# Patient Record
Sex: Female | Born: 1990 | Race: White | Hispanic: No | Marital: Married | Smoking: Never smoker
Health system: Southern US, Community
[De-identification: ages and names within clinical notes are randomized; demographics above are authoritative.]

## PROBLEM LIST (undated history)

## (undated) HISTORY — PX: TONSILLECTOMY: SUR1361

---

## 2009-09-12 ENCOUNTER — Emergency Department (HOSPITAL_BASED_OUTPATIENT_CLINIC_OR_DEPARTMENT_OTHER): Admission: EM | Admit: 2009-09-12 | Discharge: 2009-09-12 | Payer: Self-pay | Admitting: Emergency Medicine

## 2010-08-26 LAB — DIFFERENTIAL
Basophils Absolute: 0.1 10*3/uL (ref 0.0–0.1)
Lymphocytes Relative: 26 % (ref 12–46)
Neutro Abs: 5.1 10*3/uL (ref 1.7–7.7)
Neutrophils Relative %: 62 % (ref 43–77)

## 2010-08-26 LAB — URINALYSIS, ROUTINE W REFLEX MICROSCOPIC
Protein, ur: NEGATIVE mg/dL
Urobilinogen, UA: 0.2 mg/dL (ref 0.0–1.0)

## 2010-08-26 LAB — CBC
Platelets: 267 10*3/uL (ref 150–400)
RDW: 13.2 % (ref 11.5–15.5)

## 2010-08-26 LAB — COMPREHENSIVE METABOLIC PANEL
ALT: 3 U/L (ref 0–35)
Albumin: 3.9 g/dL (ref 3.5–5.2)
Alkaline Phosphatase: 84 U/L (ref 39–117)
BUN: 9 mg/dL (ref 6–23)
Potassium: 4.2 mEq/L (ref 3.5–5.1)
Sodium: 143 mEq/L (ref 135–145)
Total Protein: 7.4 g/dL (ref 6.0–8.3)

## 2010-08-26 LAB — LIPASE, BLOOD: Lipase: 124 U/L (ref 23–300)

## 2010-08-26 LAB — ETHANOL: Alcohol, Ethyl (B): <5 mg/dL (ref 0–10)

## 2010-10-27 ENCOUNTER — Emergency Department (HOSPITAL_BASED_OUTPATIENT_CLINIC_OR_DEPARTMENT_OTHER)
Admission: EM | Admit: 2010-10-27 | Discharge: 2010-10-27 | Disposition: A | Discharge status: Home / Self Care | Payer: Self-pay | Attending: Emergency Medicine | Admitting: Emergency Medicine

## 2010-10-27 DIAGNOSIS — IMO0002 Reserved for concepts with insufficient information to code with codable children: Secondary | ICD-10-CM | POA: Insufficient documentation

## 2010-10-27 LAB — WET PREP, GENITAL
WBC, Wet Prep HPF POC: NONE SEEN
Yeast Wet Prep HPF POC: NONE SEEN

## 2010-10-29 LAB — GC/CHLAMYDIA PROBE AMP, GENITAL: GC Probe Amp, Genital: NEGATIVE

## 2011-01-24 ENCOUNTER — Emergency Department (HOSPITAL_BASED_OUTPATIENT_CLINIC_OR_DEPARTMENT_OTHER)
Admission: EM | Admit: 2011-01-24 | Discharge: 2011-01-24 | Disposition: A | Discharge status: Home / Self Care | Payer: Self-pay | Attending: Emergency Medicine | Admitting: Emergency Medicine

## 2011-01-24 ENCOUNTER — Encounter: Payer: Self-pay | Admitting: *Deleted

## 2011-01-24 DIAGNOSIS — O200 Threatened abortion: Secondary | ICD-10-CM | POA: Insufficient documentation

## 2011-01-24 LAB — BASIC METABOLIC PANEL
CO2: 23 mEq/L (ref 19–32)
Calcium: 9.4 mg/dL (ref 8.4–10.5)
GFR calc Af Amer: >60 mL/min (ref >60)
GFR calc non Af Amer: >60 mL/min (ref >60)
Sodium: 137 mEq/L (ref 135–145)

## 2011-01-24 LAB — HCG, QUANTITATIVE, PREGNANCY: hCG, Beta Chain, Quant, S: 2368 m[IU]/mL — ABNORMAL HIGH (ref <5)

## 2011-01-24 LAB — URINALYSIS, ROUTINE W REFLEX MICROSCOPIC
Nitrite: NEGATIVE
Protein, ur: NEGATIVE mg/dL
Urobilinogen, UA: 0.2 mg/dL (ref 0.0–1.0)

## 2011-01-24 LAB — CBC
MCH: 28.9 pg (ref 26.0–34.0)
Platelets: 329 10*3/uL (ref 150–400)
RBC: 4.12 MIL/uL (ref 3.87–5.11)
RDW: 12.8 % (ref 11.5–15.5)

## 2011-01-24 LAB — URINE MICROSCOPIC-ADD ON

## 2011-01-24 LAB — WET PREP, GENITAL

## 2011-01-24 NOTE — ED Notes (Signed)
Pt states she is about [redacted] weeks pregnant. Spotted last night, but none today. Cramping today.

## 2011-01-24 NOTE — ED Provider Notes (Signed)
Scribed for Jessica Jakes, MD, the patient was seen in room MH11/MH11 . This chart was scribed by Ellie Lunch. This patient's care was started at 8:52 PM.   CSN: [id number] Arrival date & time: 01/24/2011  8:49 PM  Chief Complaint  Patient presents with  . Vaginal Bleeding   HPI Sheyli Horwitz is a 20 y.o. female 6-wks pregnant who presents to the Emergency Department complaining of cramping in lower abdomen starting yesterday afternoon. The cramping is intermittent and has become progressively worse since yesterday. Pt also reports light spotting yesterday that is now resolved and white vaginal discharge for the past week that is ongoing. Patient denies vaginal itching. Patient denies back pain, lightheadedness, fever, dysuria, pedal edema, are any chronic medical problems. This is her first pregnancy. LNMP 12/11/10.  Pt has not yet visited OBGYN, Dr Allena Katz.  First scheduled visit is 02/10/2011.    History reviewed. No pertinent past medical history.  Past Surgical History  Procedure Date  . Tonsillectomy     History reviewed. No pertinent family history.  History  Substance Use Topics  . Smoking status: Never Smoker   . Smokeless tobacco: Not on file  . Alcohol Use: No    OB History    Grav Para Term Preterm Abortions TAB SAB Ect Mult Living   1 0              Review of Systems 10 Systems reviewed and are negative for acute change except as noted in the HPI.   Physical Exam  BP 146/71  Pulse 101  Temp(Src) 98.8 F (37.1 C) (Oral)  Resp 18  Ht 5' 2.5" (1.588 m)  Wt 202 lb (91.627 kg)  BMI 36.36 kg/m2  SpO2 100%  LMP 12/11/2010  Physical Exam  Constitutional: She is oriented to person, place, and time. She appears well-developed and well-nourished.  HENT:  Head: Normocephalic.  Mouth/Throat: Oropharynx is clear and moist.  Eyes: EOM are normal. Pupils are equal, round, and reactive to light. No scleral icterus.  Neck: Neck supple.  Cardiovascular: Normal  rate, regular rhythm and normal heart sounds.   Pulmonary/Chest: Effort normal and breath sounds normal.  Abdominal: Soft. Bowel sounds are normal. There is no tenderness. There is no CVA tenderness.  Genitourinary: Pelvic exam was performed with patient supine.       Nrml external genitalia No vaginal bleeding Slight White vaginal discharge No uterine tenderness No cervical motion tenderness No adnexal tenderness Uterus slightly enlarged consistent with 6 week pregnancy  Musculoskeletal: Normal range of motion. She exhibits no edema.  Neurological: She is alert and oriented to person, place, and time. No cranial nerve deficit. Coordination normal.  Skin: Skin is warm and dry.  Psychiatric: She has a normal mood and affect. Her behavior is normal.   OTHER DATA REVIEWED: Nursing notes, vital signs, and past medical records reviewed.   DIAGNOSTIC STUDIES: Oxygen Saturation is 100% on room air, normal by my interpretation.     LABS / RADIOLOGY:  Results for orders placed during the hospital encounter of 01/24/11  CBC      Component Value Range   WBC 11.7 (*) 4.0 - 10.5 (K/uL)   RBC 4.12  3.87 - 5.11 (MIL/uL)   Hemoglobin 11.9 (*) 12.0 - 15.0 (g/dL)   HCT 40.9 (*) 81.1 - 46.0 (%)   MCV 85.4  78.0 - 100.0 (fL)   MCH 28.9  26.0 - 34.0 (pg)   MCHC 33.8  30.0 - 36.0 (g/dL)  RDW 12.8  11.5 - 15.5 (%)   Platelets 329  150 - 400 (K/uL)  BASIC METABOLIC PANEL      Component Value Range   Sodium 137  135 - 145 (mEq/L)   Potassium 3.5  3.5 - 5.1 (mEq/L)   Chloride 104  96 - 112 (mEq/L)   CO2 23  19 - 32 (mEq/L)   Glucose, Bld 107 (*) 70 - 99 (mg/dL)   BUN 7  6 - 23 (mg/dL)   Creatinine, Ser 4.09  0.50 - 1.10 (mg/dL)   Calcium 9.4  8.4 - 81.1 (mg/dL)   GFR calc non Af Amer >60  >60 (mL/min)   GFR calc Af Amer >60  >60 (mL/min)  URINALYSIS, ROUTINE W REFLEX MICROSCOPIC      Component Value Range   Color, Urine YELLOW  YELLOW    Appearance CLOUDY (*) CLEAR    Specific Gravity,  Urine 1.022  1.005 - 1.030    pH 6.5  5.0 - 8.0    Glucose, UA NEGATIVE  NEGATIVE (mg/dL)   Hgb urine dipstick NEGATIVE  NEGATIVE    Bilirubin Urine NEGATIVE  NEGATIVE    Ketones, ur NEGATIVE  NEGATIVE (mg/dL)   Protein, ur NEGATIVE  NEGATIVE (mg/dL)   Urobilinogen, UA 0.2  0.0 - 1.0 (mg/dL)   Nitrite NEGATIVE  NEGATIVE    Leukocytes, UA TRACE (*) NEGATIVE   HCG, QUANTITATIVE, PREGNANCY      Component Value Range   hCG, Beta Chain, Quant, S 2368 (*) <5 (mIU/mL)  WET PREP, GENITAL      Component Value Range   Yeast, Wet Prep NONE SEEN  NONE SEEN    Trich, Wet Prep NONE SEEN  NONE SEEN    Clue Cells, Wet Prep MODERATE (*) NONE SEEN    WBC, Wet Prep HPF POC FEW (*) NONE SEEN   URINE MICROSCOPIC-ADD ON      Component Value Range   Squamous Epithelial / LPF MANY (*) RARE    WBC, UA 3-6  <3 (WBC/hpf)   RBC / HPF 0-2  <3 (RBC/hpf)   Bacteria, UA MANY (*) RARE    10:43 PM UA reports many bacteria present many bacteria present, presence of epithelial cells indicates urine sample was likely not a clean catch.   ED COURSE / COORDINATION OF CARE: 9:07 PM EDP confirmed [place] not available tonight.  9:30 PM Pelvic exam performed with female nurse chaperone.  10:39 PM [place] scheduled for tomorrow    MDM:   EARLY PREGNANCY WITHOUT SIG PAIN OR PELVIC OR VAGINAL BLEEDING WILL NEED [place] IN AM HERE TO CONFIRM IUP AND R/O ECTOPIC.   IMPRESSION: Diagnoses that have been ruled out:  Diagnoses that are still under consideration:  Final diagnoses:    PLAN:  Home  The patient is to return the emergency department if there is any worsening of symptoms. I have reviewed the discharge instructions with the patient  CONDITION ON DISCHARGE: Stable  MEDICATIONS GIVEN IN THE E.D. Medications - No data to display  DISCHARGE MEDICATIONS: New Prescriptions   No medications on file    Procedures   I personally performed the services described in this documentation, which was scribed in my presence.  The recorded information has been reviewed and considered. Jessica Jakes, MD    Jessica Jakes, MD 01/24/11 [phone]

## 2011-01-25 ENCOUNTER — Other Ambulatory Visit (HOSPITAL_BASED_OUTPATIENT_CLINIC_OR_DEPARTMENT_OTHER): Payer: Self-pay | Admitting: Emergency Medicine

## 2011-01-25 ENCOUNTER — Ambulatory Visit (HOSPITAL_BASED_OUTPATIENT_CLINIC_OR_DEPARTMENT_OTHER)
Admission: RE | Admit: 2011-01-25 | Discharge: 2011-01-25 | Disposition: A | Discharge status: Home / Self Care | Payer: Self-pay | Source: Ambulatory Visit | Attending: Emergency Medicine | Admitting: Emergency Medicine

## 2011-01-25 ENCOUNTER — Telehealth (HOSPITAL_BASED_OUTPATIENT_CLINIC_OR_DEPARTMENT_OTHER): Payer: Self-pay | Admitting: Emergency Medicine

## 2011-01-25 DIAGNOSIS — O99891 Other specified diseases and conditions complicating pregnancy: Secondary | ICD-10-CM | POA: Insufficient documentation

## 2011-01-25 DIAGNOSIS — Z3201 Encounter for pregnancy test, result positive: Secondary | ICD-10-CM

## 2011-01-25 DIAGNOSIS — O26859 Spotting complicating pregnancy, unspecified trimester: Secondary | ICD-10-CM | POA: Insufficient documentation

## 2011-01-25 DIAGNOSIS — N898 Other specified noninflammatory disorders of vagina: Secondary | ICD-10-CM

## 2011-01-25 DIAGNOSIS — R109 Unspecified abdominal pain: Secondary | ICD-10-CM | POA: Insufficient documentation

## 2011-01-25 LAB — ABO/RH: ABO/RH(D): A NEG
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
## 2012-03-23 ENCOUNTER — Encounter (HOSPITAL_BASED_OUTPATIENT_CLINIC_OR_DEPARTMENT_OTHER): Payer: Self-pay | Admitting: Emergency Medicine

## 2012-03-23 ENCOUNTER — Emergency Department (HOSPITAL_BASED_OUTPATIENT_CLINIC_OR_DEPARTMENT_OTHER)
Admission: EM | Admit: 2012-03-23 | Discharge: 2012-03-24 | Disposition: A | Discharge status: Home / Self Care | Payer: No Typology Code available for payment source | Attending: Emergency Medicine | Admitting: Emergency Medicine

## 2012-03-23 DIAGNOSIS — O99891 Other specified diseases and conditions complicating pregnancy: Secondary | ICD-10-CM | POA: Insufficient documentation

## 2012-03-23 DIAGNOSIS — R109 Unspecified abdominal pain: Secondary | ICD-10-CM | POA: Insufficient documentation

## 2012-03-23 DIAGNOSIS — Z043 Encounter for examination and observation following other accident: Secondary | ICD-10-CM | POA: Insufficient documentation

## 2012-03-23 DIAGNOSIS — Z3490 Encounter for supervision of normal pregnancy, unspecified, unspecified trimester: Secondary | ICD-10-CM
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
## 2012-09-11 IMAGING — US US OB TRANSVAGINAL
1 series · 14 of 28 positions shown · non-contrast
Comparison: None.

CLINICAL DATA: Cause of pregnancy test with cramping and spotting.

OBSTETRIC <14 WK US AND TRANSVAGINAL OB US
TECHNIQUE: Both transabdominal and transvaginal ultrasound
examinations were performed for complete evaluation of the
gestation as well as the maternal uterus, adnexal regions, and
pelvic cul-de-sac.  Transvaginal technique was performed to assess
early pregnancy.

[Series 1: us ob transvaginal · 0.30mm/px · 14 of 41 slices shown]
[im 2/41]
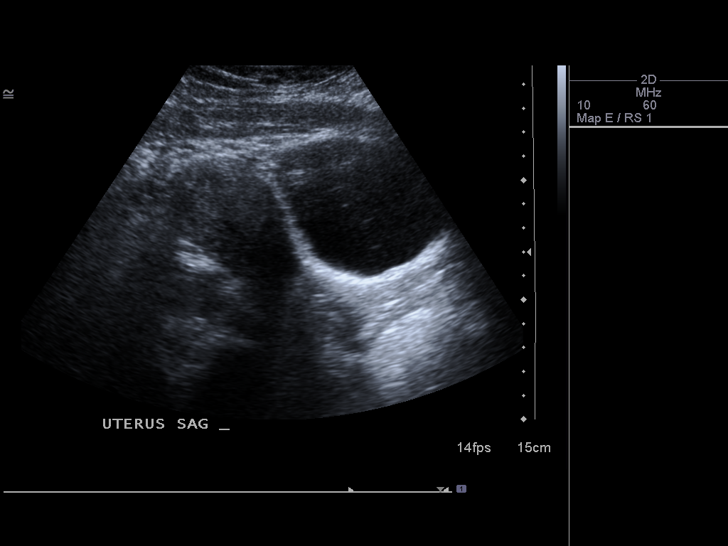
[im 5/41]
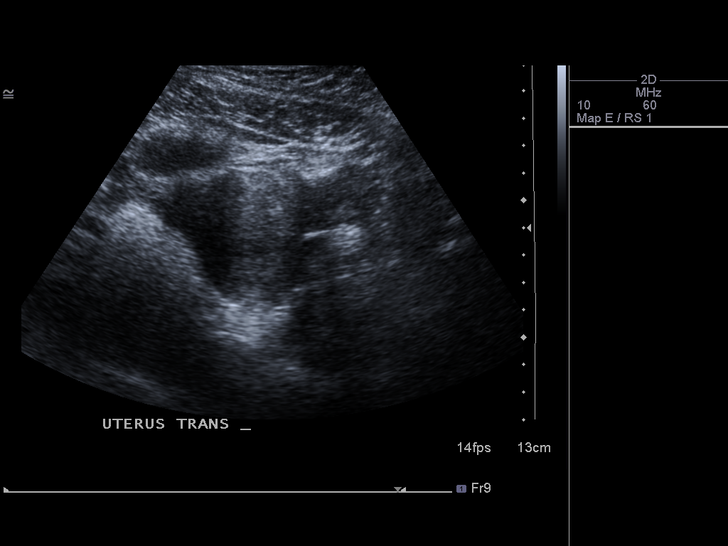
[im 8/41]
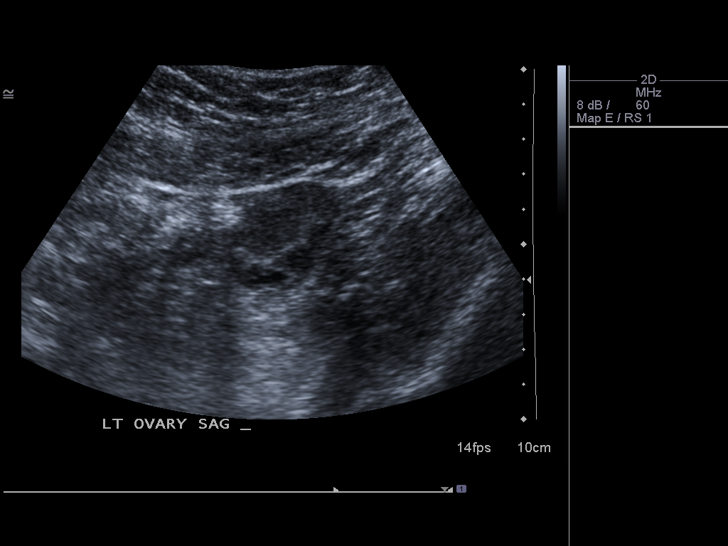
[im 11/41]
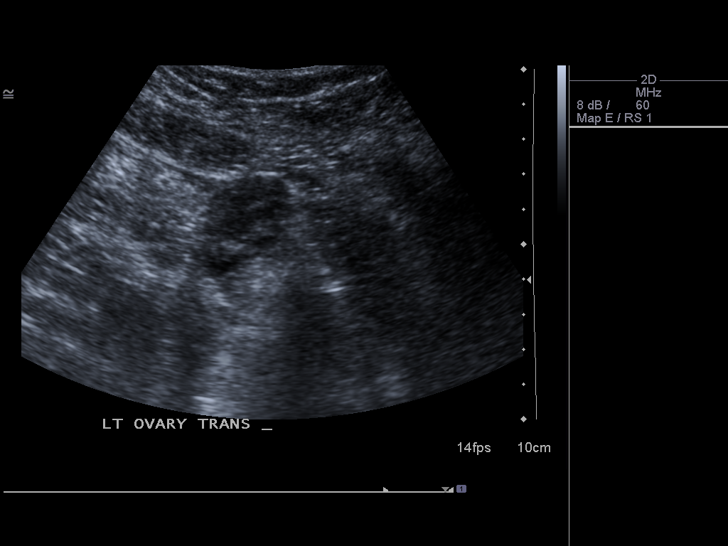
[im 14/41]
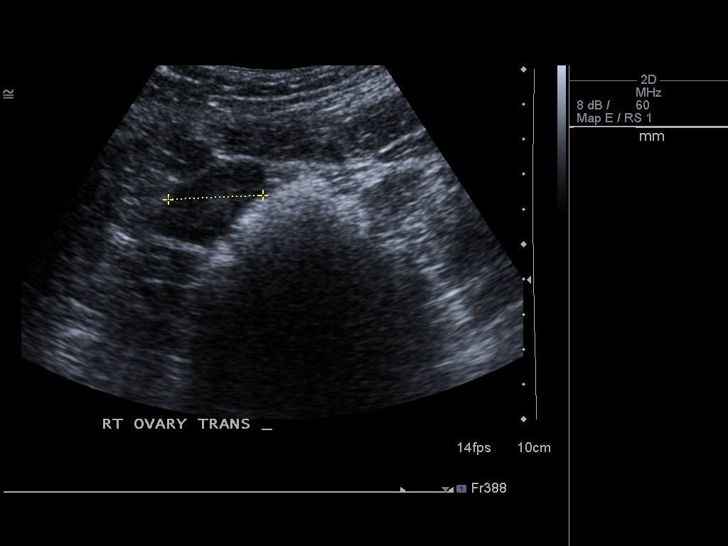
[im 17/41]
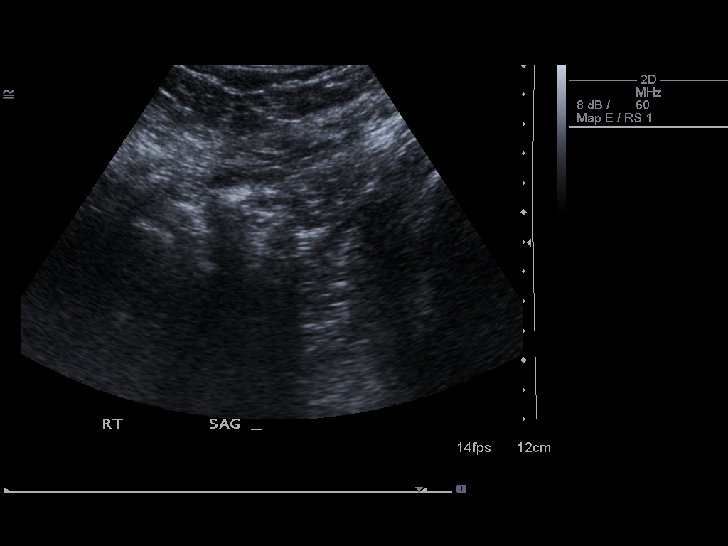
[im 20/41]
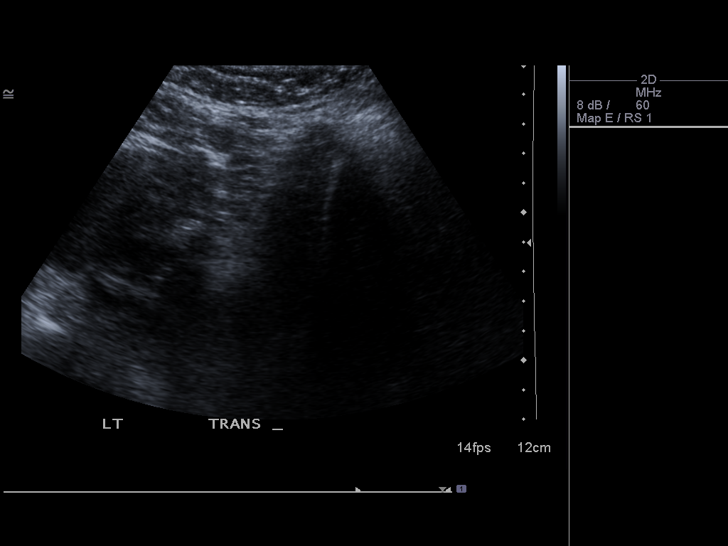
[im 23/41]
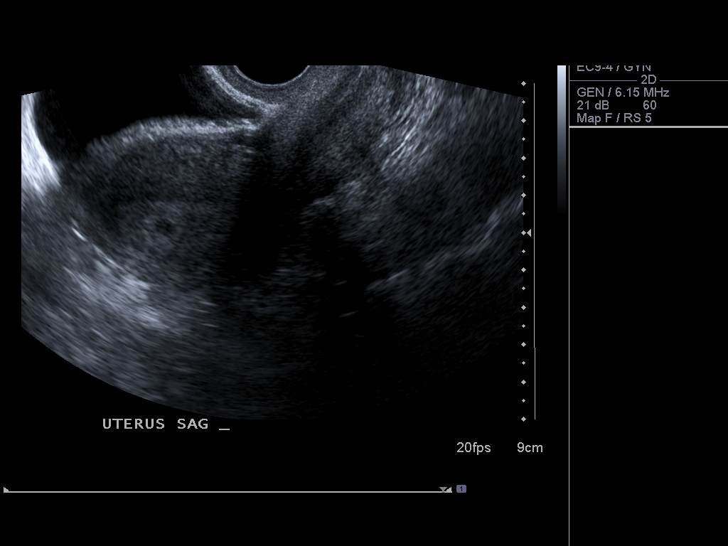
[im 26/41]
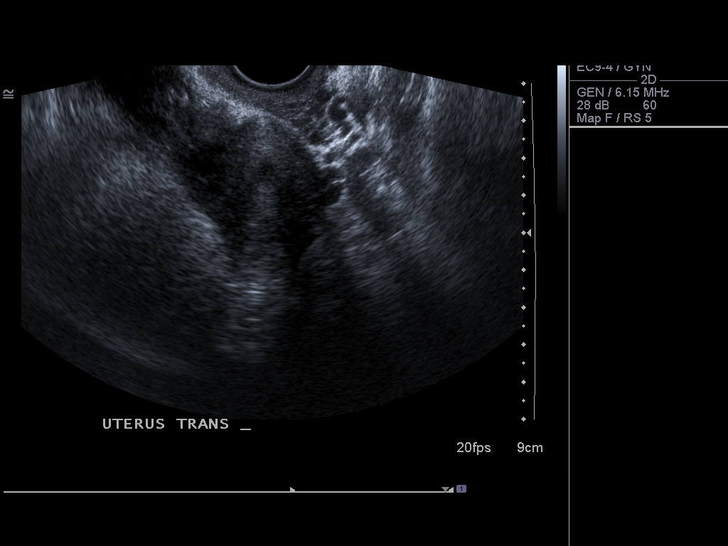
[im 29/41]
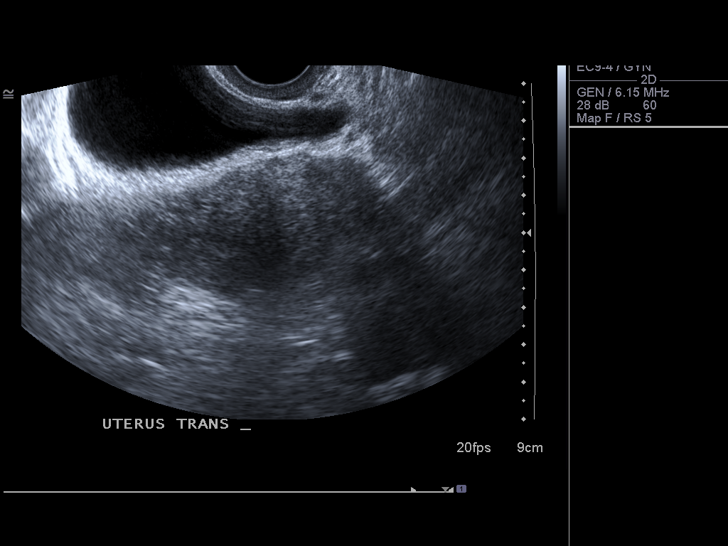
[im 32/41]
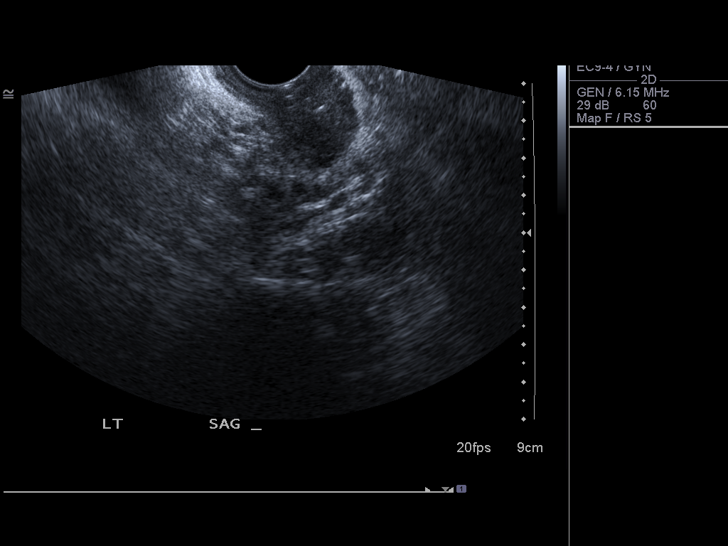
[im 35/41]
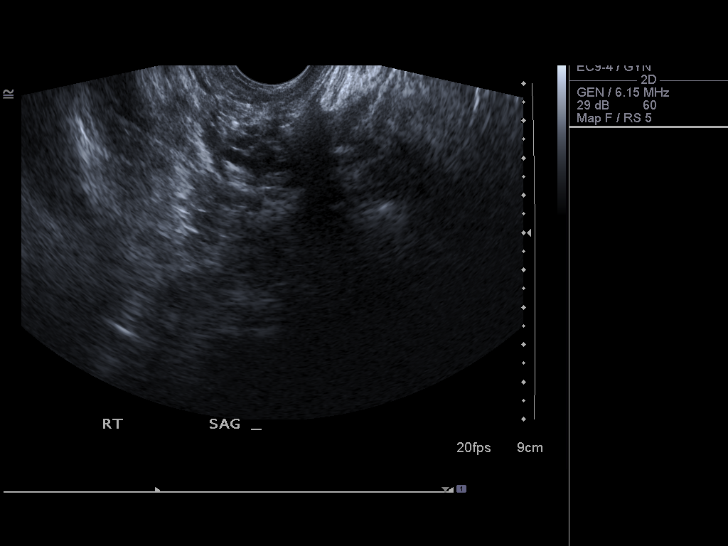
[im 38/41]
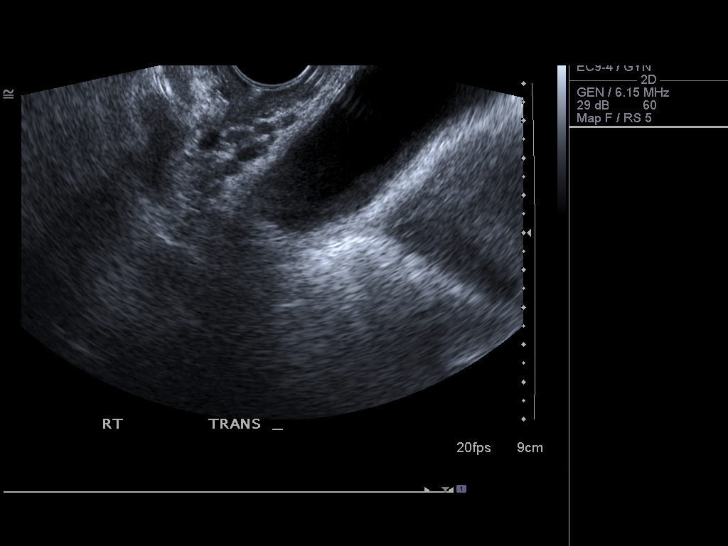
[im 41/41]
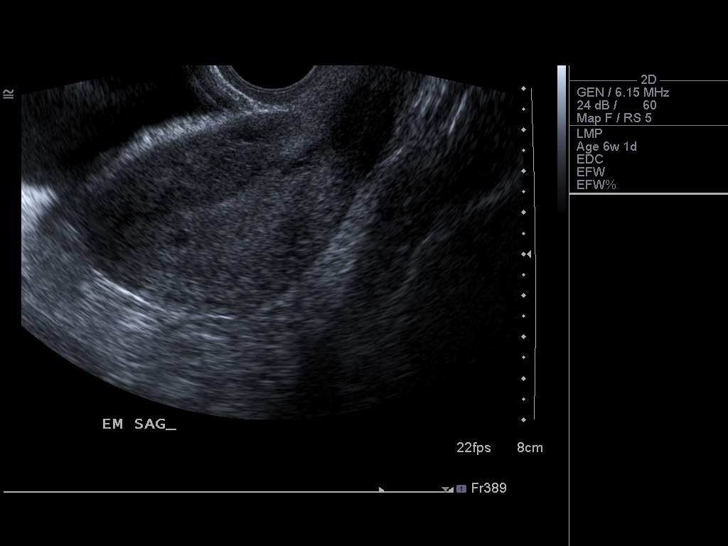

[14 of 28 positions shown; findings below may reference images not displayed]

Tiny round hypoechoic structures identified in the endometrial
region of the uterine fundus.  This may well be the tiny
gestational sac but no embryo or yolk sac is evident.

The maternal ovaries are sonographically normal.  There is no
evidence for adnexal mass.  There may be a trace amount of free
fluid in the cul-de-sac.
IMPRESSION: Probable very early intrauterine gestational sac although without a
visible yolk sac or embryo, this cannot be confirmed.  Given the
history of a positive pregnancy test, close follow-up will be
required as non-visualized ectopic pregnancy remains a
consideration.

## 2014-04-08 ENCOUNTER — Encounter (HOSPITAL_BASED_OUTPATIENT_CLINIC_OR_DEPARTMENT_OTHER): Payer: Self-pay | Admitting: Emergency Medicine

## 2014-05-04 ENCOUNTER — Emergency Department (HOSPITAL_BASED_OUTPATIENT_CLINIC_OR_DEPARTMENT_OTHER): Payer: BC Managed Care – PPO

## 2014-05-04 ENCOUNTER — Encounter (HOSPITAL_BASED_OUTPATIENT_CLINIC_OR_DEPARTMENT_OTHER): Payer: Self-pay | Admitting: *Deleted

## 2014-05-04 ENCOUNTER — Emergency Department (HOSPITAL_BASED_OUTPATIENT_CLINIC_OR_DEPARTMENT_OTHER)
Admission: EM | Admit: 2014-05-04 | Discharge: 2014-05-04 | Disposition: A | Discharge status: Home / Self Care | Payer: BC Managed Care – PPO | Attending: Emergency Medicine | Admitting: Emergency Medicine

## 2014-05-04 DIAGNOSIS — N939 Abnormal uterine and vaginal bleeding, unspecified: Secondary | ICD-10-CM

## 2014-05-04 DIAGNOSIS — O200 Threatened abortion: Secondary | ICD-10-CM | POA: Diagnosis not present

## 2014-05-04 DIAGNOSIS — Z3A12 12 weeks gestation of pregnancy: Secondary | ICD-10-CM | POA: Insufficient documentation

## 2014-05-04 DIAGNOSIS — O209 Hemorrhage in early pregnancy, unspecified: Secondary | ICD-10-CM | POA: Diagnosis present

## 2014-05-04 LAB — URINALYSIS, ROUTINE W REFLEX MICROSCOPIC
Bilirubin Urine: NEGATIVE
Glucose, UA: NEGATIVE mg/dL
Hgb urine dipstick: NEGATIVE
KETONES UR: NEGATIVE mg/dL
LEUKOCYTES UA: NEGATIVE
NITRITE: NEGATIVE
PH: 5.5 (ref 5.0–8.0)
Protein, ur: NEGATIVE mg/dL
SPECIFIC GRAVITY, URINE: 1.024 (ref 1.005–1.030)
Urobilinogen, UA: 0.2 mg/dL (ref 0.0–1.0)

## 2014-05-04 LAB — PREGNANCY, URINE: PREG TEST UR: POSITIVE — AB

## 2014-05-04 LAB — ABO/RH
ABO/RH(D): A NEG
Antibody Screen: NEGATIVE

## 2014-05-04 LAB — HCG, QUANTITATIVE, PREGNANCY: hCG, Beta Chain, Quant, S: 6715 m[IU]/mL — ABNORMAL HIGH (ref <5)

## 2014-05-04 MED ORDER — RHO D IMMUNE GLOBULIN 1500 UNIT/2ML IJ SOSY
PREFILLED_SYRINGE | INTRAMUSCULAR | Status: AC
  Filled 2014-05-04: qty 2

## 2014-05-04 MED ORDER — RHO D IMMUNE GLOBULIN 1500 UNIT/2ML IJ SOSY
300.0000 ug | PREFILLED_SYRINGE | Freq: Once | INTRAMUSCULAR | Status: AC
  Administered 2014-05-04: 300 ug via INTRAMUSCULAR

## 2014-05-04 NOTE — ED Notes (Signed)
Patient states that earlier today she passed a blood clot, pt is [redacted] weeks pregnant

## 2014-05-04 NOTE — ED Provider Notes (Signed)
CSN: [id number]     Arrival date & time 05/04/14  1416 History  This chart was scribed for Tilden FossaElizabeth Shannen Flansburg, MD by Gwenyth Oberatherine Macek, ED Scribe. This patient was seen in room MH02/MH02 and the patient's care was started at 4:51 PM.    Chief Complaint  Patient presents with  . Vaginal Bleeding   The history is provided by the patient. No language interpreter was used.    HPI Comments: Jessica Kelly is a 23 y.o. female who is [redacted] weeks pregnant and presents to the Emergency Department complaining of a quarter-sized blood clot that occurred a few hours ago. Pt states that she has not had an US yet and that current due date is June 10 based on LMP. This is pt's 3rd pregnancy. She notes history of HTN during last two pregnancies. She also reports taking progesterone for spotting in 1st pregnancy. Both prior pregnancy were born to term. Pt denies recent injuries and sexual intercourse. Pt is A negative and takes Rhogam shot. She denies vaginal bleeding, vaginal discharge, fevers, vomiting and dysuria as associated symptoms. Sxs are moderate, intermittent, and improving.   History reviewed. No pertinent past medical history. Past Surgical History  Procedure Laterality Date  . Tonsillectomy    . Cesarean section     No family history on file. History  Substance Use Topics  . Smoking status: Never Smoker   . Smokeless tobacco: Not on file  . Alcohol Use: No   OB History    Gravida Para Term Preterm AB TAB SAB Ectopic Multiple Living   1 0             Review of Systems  Constitutional: Negative for fever.  Gastrointestinal: Negative for vomiting.  Genitourinary: Negative for dysuria and vaginal bleeding.       Passed blood clot  All other systems reviewed and are negative.   Allergies  Coconut oil and Watermelon concentrate  Home Medications   Prior to Admission medications   Medication Sig Start Date End Date Taking? Authorizing Provider  Prenatal Vit-Fe Psac Cmplx-FA (PRENATAL  MULTIVITAMIN) 60-1 MG tablet Take 1 tablet by mouth daily.      Historical Provider, MD   BP 117/63 mmHg  Pulse 92  Temp(Src) 98.6 F (37 C) (Oral)  Resp 18  SpO2 100% Physical Exam  Constitutional: She is oriented to person, place, and time. She appears well-developed and well-nourished.  HENT:  Head: Normocephalic and atraumatic.  Cardiovascular: Normal rate and regular rhythm.   No murmur heard. Pulmonary/Chest: Effort normal and breath sounds normal. No respiratory distress.  Abdominal: Soft. There is no tenderness. There is no rebound and no guarding.  Genitourinary:  Scant white discharge. No blood. No tenderness.  Musculoskeletal: She exhibits no edema or tenderness.  Neurological: She is alert and oriented to person, place, and time.  Skin: Skin is warm and dry.  Psychiatric: She has a normal mood and affect. Her behavior is normal.  Nursing note and vitals reviewed.   ED Course  Procedures (including critical care time) DIAGNOSTIC STUDIES: Oxygen Saturation is 100% on RA, normal by my interpretation.    COORDINATION OF CARE: 4:56 PM Discussed treatment plan with pt which includes lab work and US. Pt agreed to plan.   Labs Review Labs Reviewed  PREGNANCY, URINE - Abnormal; Notable for the following:    Preg Test, Ur POSITIVE (*)    All other components within normal limits  HCG, QUANTITATIVE, PREGNANCY - Abnormal; Notable for the following:  hCG, Beta Chain, Quant, S 6715 (*)    All other components within normal limits  URINALYSIS, ROUTINE W REFLEX MICROSCOPIC  ABO/RH    Imaging Review [place] Ob Comp Less 14 Wks  05/04/2014   CLINICAL DATA:  Pregnancy, vaginal bleeding.  EXAM: OBSTETRIC <14 WK US AND TRANSVAGINAL OB US  TECHNIQUE: Both transabdominal and transvaginal ultrasound examinations were performed for complete evaluation of the gestation as well as the maternal uterus, adnexal regions, and pelvic cul-de-sac. Transvaginal technique was performed to  assess early pregnancy.  COMPARISON:  None.  FINDINGS: Intrauterine gestational sac: Visualized/normal in shape.  Yolk sac:  Visualized.  Embryo:  Not visualized.  Cardiac Activity: Not visualized.  MSD:  8  mm   5 w   4  d  US EDC: December 31, 2014.  Maternal uterus/adnexae: Probable corpus luteum cyst seen in right ovary. Left ovary appears normal. Small sub chronic images noted. Small amount of free fluid is noted.  IMPRESSION: Probable early intrauterine gestational sac with yolk sac, but no fetal pole or cardiac activity yet visualized. Possible small subchorionic hemorrhage present as well. Recommend follow-up quantitative B-HCG levels and follow-up US in 14 days to confirm and assess viability. This recommendation follows SRU consensus guidelines: Diagnostic Criteria for Nonviable Pregnancy Early in the First Trimester. Malva Limes Engl J Med 2013; 161:0960-45; 369:1443-51.   Electronically Signed   By: Roque LiasJames  Green M.D.   On: 05/04/2014 18:53   [place] Ob Transvaginal  05/04/2014   CLINICAL DATA:  Pregnancy, vaginal bleeding.  EXAM: OBSTETRIC <14 WK US AND TRANSVAGINAL OB US  TECHNIQUE: Both transabdominal and transvaginal ultrasound examinations were performed for complete evaluation of the gestation as well as the maternal uterus, adnexal regions, and pelvic cul-de-sac. Transvaginal technique was performed to assess early pregnancy.  COMPARISON:  None.  FINDINGS: Intrauterine gestational sac: Visualized/normal in shape.  Yolk sac:  Visualized.  Embryo:  Not visualized.  Cardiac Activity: Not visualized.  MSD:  8  mm   5 w   4  d  US EDC: December 31, 2014.  Maternal uterus/adnexae: Probable corpus luteum cyst seen in right ovary. Left ovary appears normal. Small sub chronic images noted. Small amount of free fluid is noted.  IMPRESSION: Probable early intrauterine gestational sac with yolk sac, but no fetal pole or cardiac activity yet visualized. Possible small subchorionic hemorrhage present as well. Recommend follow-up quantitative  B-HCG levels and follow-up US in 14 days to confirm and assess viability. This recommendation follows SRU consensus guidelines: Diagnostic Criteria for Nonviable Pregnancy Early in the First Trimester. Malva Limes Engl J Med 2013; 409:8119-14; 369:1443-51.   Electronically Signed   By: Roque LiasJames  Green M.D.   On: 05/04/2014 18:53     EKG Interpretation None      MDM   Final diagnoses:  Vaginal bleeding  Miscarriage, threatened, early pregnancy    Patient here with first trimester pregnancy and complaints of vaginal bleeding. There is no blood present on pelvic examination and os is closed. Ultrasound consistent with very early IUP. Discussed with patient threatened miscarriage and importance of OB follow-up as well as return precautions. Clinical picture not consistent with ruptured ectopic.  I personally performed the services described in this documentation, which was scribed in my presence. The recorded information has been reviewed and is accurate.       Tilden FossaElizabeth Ori Trejos, MD 05/05/14 [phone]

## 2014-05-04 NOTE — Discharge Instructions (Signed)

## 2014-05-04 NOTE — ED Notes (Signed)
On med hold until 2030. Will discharge after med hold

## 2015-12-20 IMAGING — US US OB TRANSVAGINAL
1 series · 13 of 28 positions shown · non-contrast
Comparison: None.

CLINICAL DATA: Pregnancy, vaginal bleeding.

EXAM:
OBSTETRIC <14 WK US AND TRANSVAGINAL OB US
TECHNIQUE: Both transabdominal and transvaginal ultrasound examinations were
performed for complete evaluation of the gestation as well as the
maternal uterus, adnexal regions, and pelvic cul-de-sac.
Transvaginal technique was performed to assess early pregnancy.

[Series 1: us ob transvaginal · 0.18mm/px · 114 acquisitions, 13 frames shown]
[im 5/114]
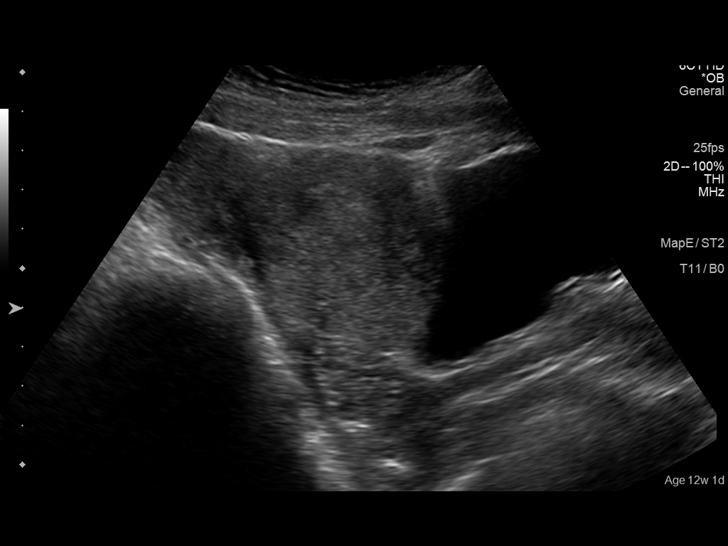
[im 13/114]
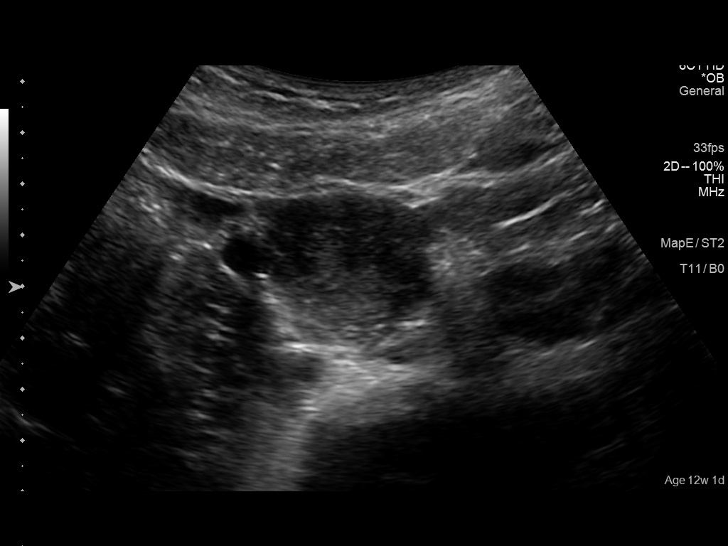
[im 21/114]
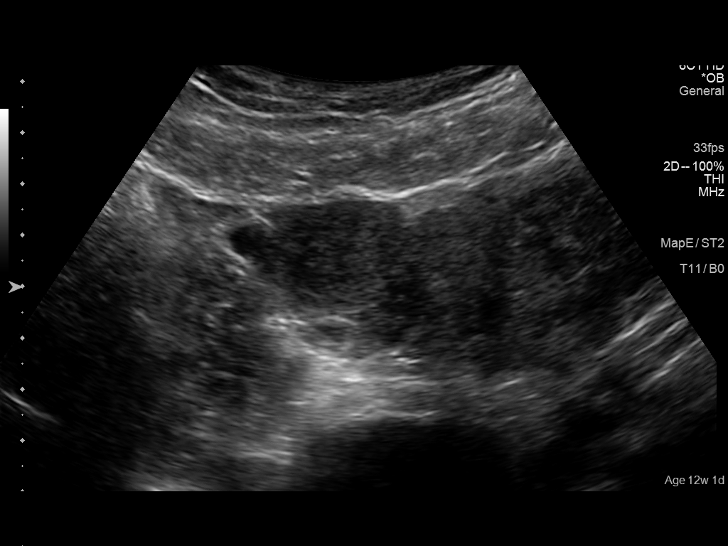
[im 30/114]
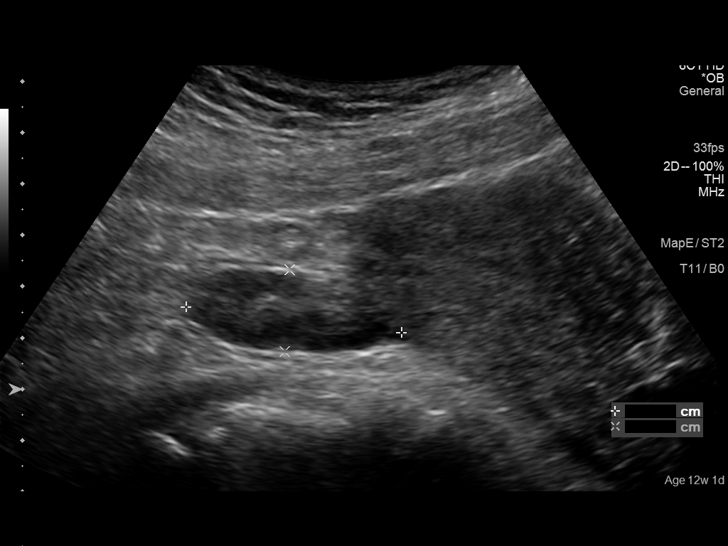
[im 38/114]
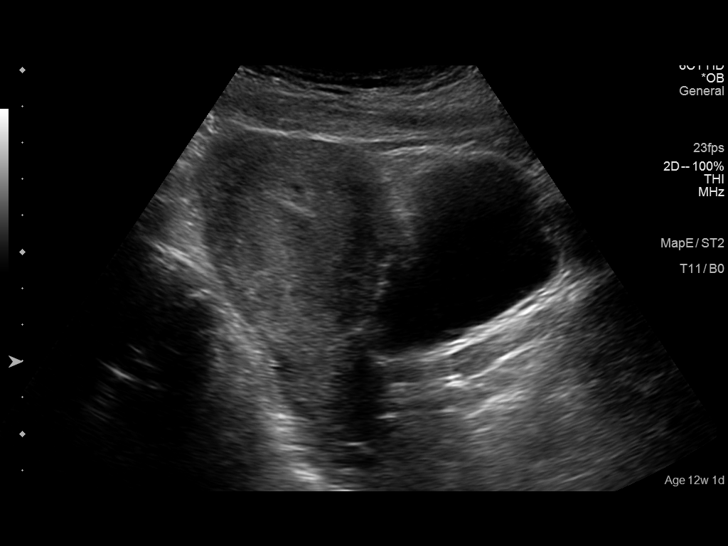
[im 47/114]
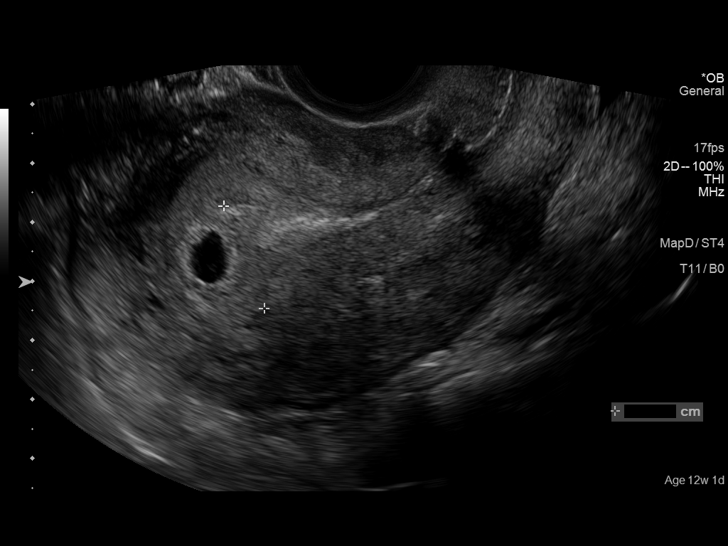
[im 59/114]
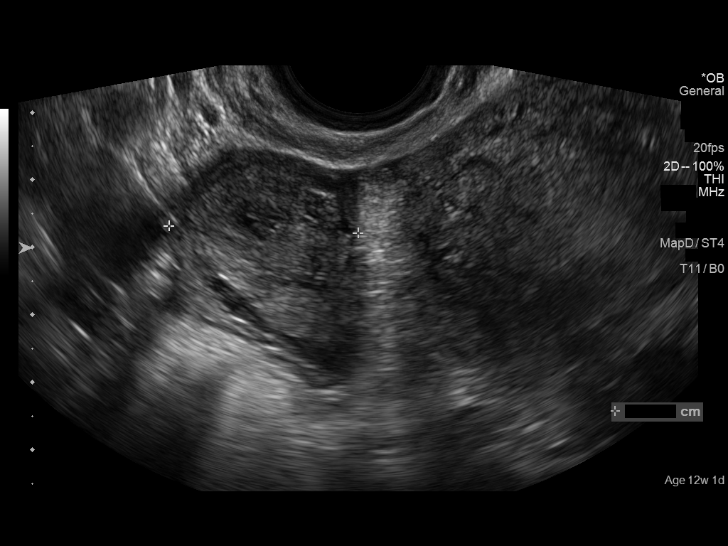
[im 67/114]
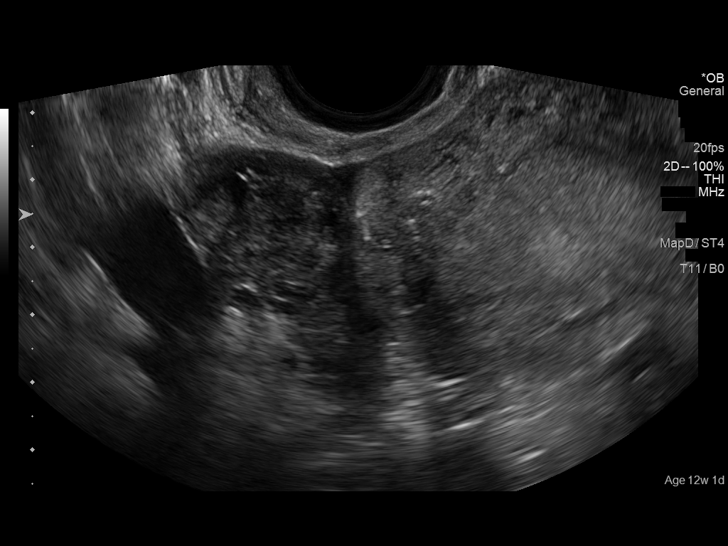
[im 76/114]
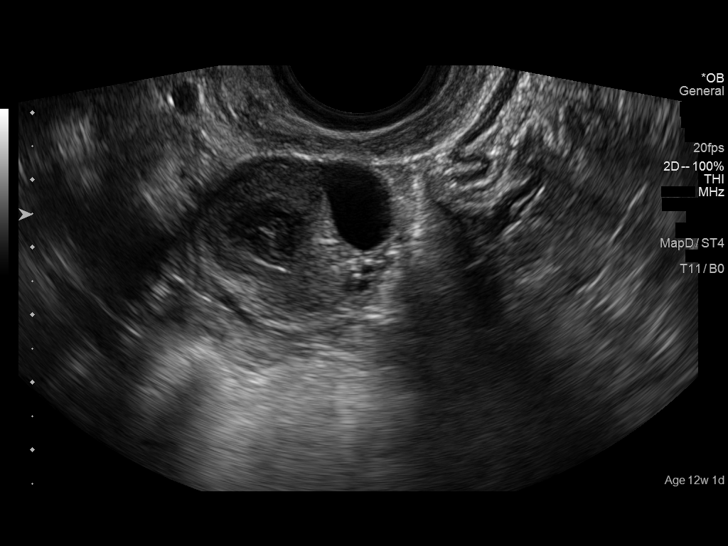
[im 84/114]
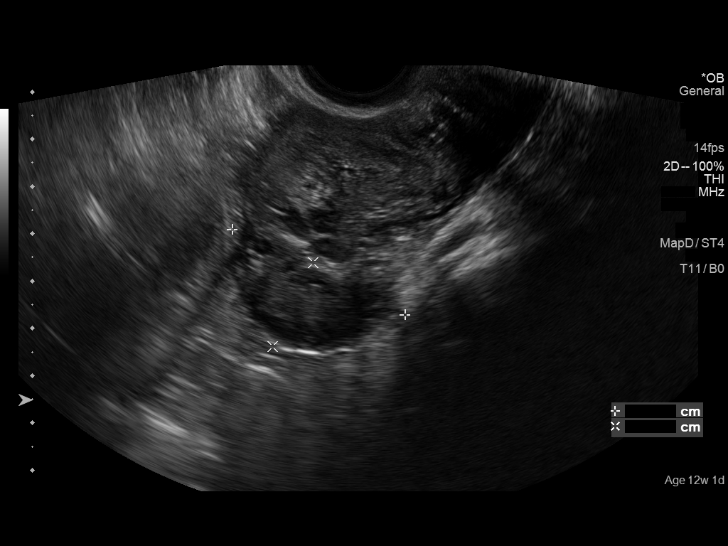
[im 93/114]
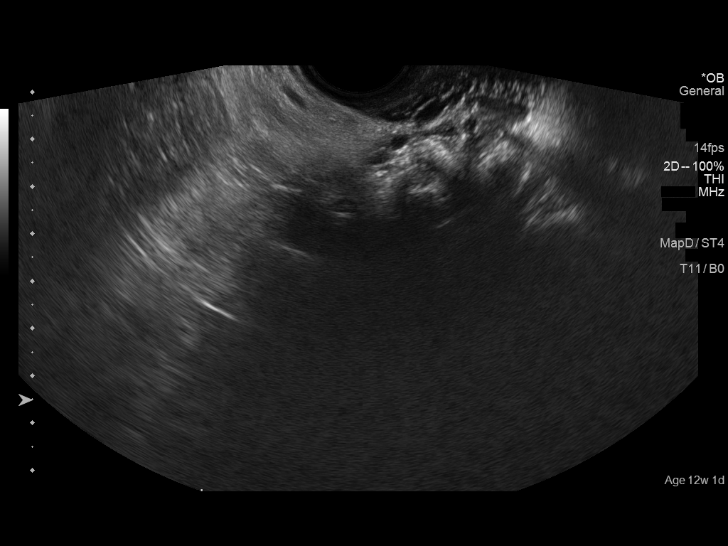
[im 101/114]
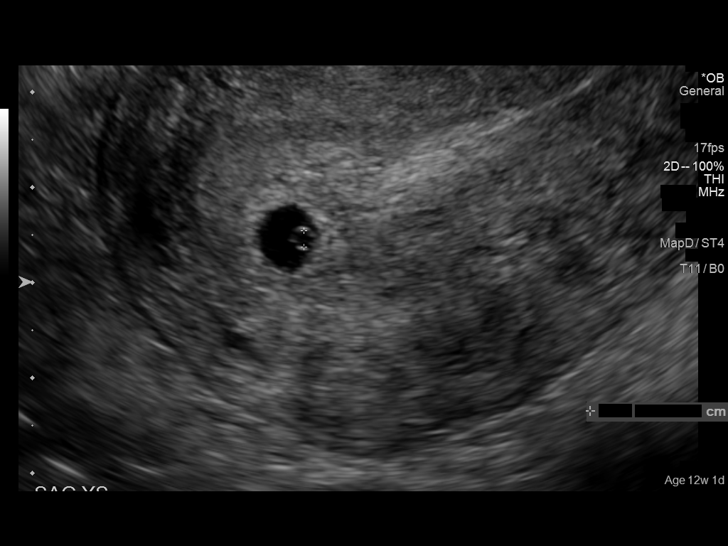
[im 109/114]
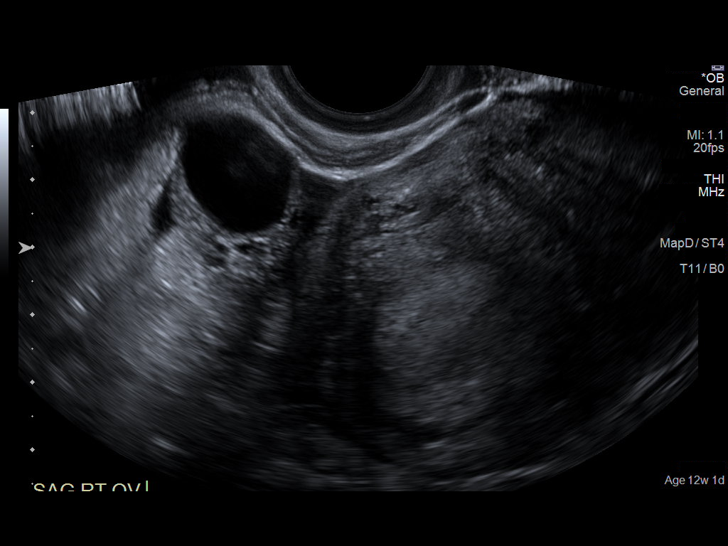

[13 of 28 positions shown; findings below may reference images not displayed]

FINDINGS: Intrauterine gestational sac: Visualized/normal in shape.

Yolk sac:  Visualized.

Embryo:  Not visualized.

Cardiac Activity: Not visualized.

MSD:  8  mm   5 w   4  d

US EDC: December 31, 2014.

Maternal uterus/adnexae: Probable corpus luteum cyst seen in right
ovary. Left ovary appears normal. Small sub chronic images noted.
Small amount of free fluid is noted.
IMPRESSION: Probable early intrauterine gestational sac with yolk sac, but no
fetal pole or cardiac activity yet visualized. Possible small
subchorionic hemorrhage present as well. Recommend follow-up
quantitative B-HCG levels and follow-up US in 14 days to confirm and
assess viability. This recommendation follows SRU consensus
guidelines: Diagnostic Criteria for Nonviable Pregnancy Early in the
First Trimester. N Engl J Med 1595; [DATE].
